# Patient Record
Sex: Male | Born: 1980 | Race: Black or African American | Hispanic: No | Marital: Single | Smoking: Never smoker
Health system: Southern US, Community
[De-identification: ages and names within clinical notes are randomized; demographics above are authoritative.]

---

## 2007-04-18 ENCOUNTER — Emergency Department (HOSPITAL_COMMUNITY): Admission: EM | Admit: 2007-04-18 | Discharge: 2007-04-18 | Payer: Self-pay | Admitting: Family Medicine

## 2011-05-07 ENCOUNTER — Emergency Department (HOSPITAL_COMMUNITY)
Admission: EM | Admit: 2011-05-07 | Discharge: 2011-05-07 | Disposition: A | Discharge status: Home / Self Care | Payer: Managed Care, Other (non HMO) | Attending: Emergency Medicine | Admitting: Emergency Medicine

## 2011-05-07 ENCOUNTER — Emergency Department (HOSPITAL_COMMUNITY): Payer: Managed Care, Other (non HMO)

## 2011-05-07 ENCOUNTER — Encounter (HOSPITAL_COMMUNITY): Payer: Self-pay | Admitting: *Deleted

## 2011-05-07 DIAGNOSIS — S62329A Displaced fracture of shaft of unspecified metacarpal bone, initial encounter for closed fracture: Secondary | ICD-10-CM | POA: Insufficient documentation

## 2011-05-07 DIAGNOSIS — R609 Edema, unspecified: Secondary | ICD-10-CM | POA: Insufficient documentation

## 2011-05-07 DIAGNOSIS — Y9239 Other specified sports and athletic area as the place of occurrence of the external cause: Secondary | ICD-10-CM | POA: Insufficient documentation

## 2011-05-07 DIAGNOSIS — M79609 Pain in unspecified limb: Secondary | ICD-10-CM | POA: Insufficient documentation

## 2011-05-07 DIAGNOSIS — S62306A Unspecified fracture of fifth metacarpal bone, right hand, initial encounter for closed fracture: Secondary | ICD-10-CM

## 2011-05-07 DIAGNOSIS — W219XXA Striking against or struck by unspecified sports equipment, initial encounter: Secondary | ICD-10-CM | POA: Insufficient documentation

## 2011-05-07 DIAGNOSIS — M7989 Other specified soft tissue disorders: Secondary | ICD-10-CM | POA: Insufficient documentation

## 2011-05-07 DIAGNOSIS — S6990XA Unspecified injury of unspecified wrist, hand and finger(s), initial encounter: Secondary | ICD-10-CM | POA: Insufficient documentation

## 2011-05-07 DIAGNOSIS — Y9367 Activity, basketball: Secondary | ICD-10-CM | POA: Insufficient documentation

## 2011-05-07 MED ORDER — PERCOCET 5-325 MG PO TABS: 1.0000 | ORAL_TABLET | Freq: Four times a day (QID) | ORAL | Status: AC | PRN

## 2011-05-07 NOTE — ED Provider Notes (Signed)
Medical screening examination/treatment/procedure(s) were performed by non-physician practitioner and as supervising physician I was immediately available for consultation/collaboration. Beyonka Pitney Y.   Gavin Pound. Felica Chargois, MD 05/07/11 1610

## 2011-05-07 NOTE — ED Notes (Signed)
Pt states he was playing basketball this morning and hit his right hand on the wall. Pt has some swelling to the right hand. Pt is able to move finger and has present pulse.

## 2011-05-07 NOTE — ED Provider Notes (Signed)
History     CSN: [id number]  Arrival date & time 05/07/11  4098   First MD Initiated Contact with Patient 05/07/11 [phone]      Chief Complaint  Patient presents with  . Hand Injury    (Consider location/radiation/quality/duration/timing/severity/associated sxs/prior treatment) HPI Comments: Patient reports he was playing basketball and ran into a wall with his right fist.  Reports pain and swelling of his right fifth metacarpal. Pain is described as slight and an "irritation."  Denies any decrease in sensation.  Denies any difficulty moving his fingers, hand or wrist.  Denies other injury.    Patient is a 31 y.o. male presenting with hand injury. The history is provided by the patient.  Hand Injury     History reviewed. No pertinent past medical history.  History reviewed. No pertinent past surgical history.  No family history on file.  History  Substance Use Topics  . Smoking status: Never Smoker   . Smokeless tobacco: Not on file  . Alcohol Use: No     occa      Review of Systems  All other systems reviewed and are negative.    Allergies  Review of patient's allergies indicates no known allergies.  Home Medications   Current Outpatient Rx  Name Route Sig Dispense Refill  . IBUPROFEN 200 MG PO TABS Oral Take 800 mg by mouth every 8 (eight) hours as needed. For pain.      BP 125/71  Pulse 78  Temp(Src) 98.1 F (36.7 C) (Oral)  Resp 22  Ht 6' (1.829 m)  Wt 165 lb (74.844 kg)  BMI 22.38 kg/m2  SpO2 98%  Physical Exam  Nursing note and vitals reviewed. Constitutional: He is oriented to person, place, and time. He appears well-developed and well-nourished.  HENT:  Head: Normocephalic and atraumatic.  Neck: Neck supple.  Pulmonary/Chest: Effort normal.  Musculoskeletal: Normal range of motion. He exhibits edema.       Hands:      Right hand with swelling and tenderness over dorsal fifth metacarpal.  Patient has full active range of motion of all digits  and full sensation of the digits.  Capillary refill is less than 2 seconds throughout.  Radial pulses intact.  Neurological: He is alert and oriented to person, place, and time.  Psychiatric: He has a normal mood and affect. His behavior is normal.    ED Course  Procedures (including critical care time)  Labs Reviewed - No data to display Dg Hand Complete Right  05/07/2011  *RADIOLOGY REPORT*  Clinical Data: Patient injured playing basketball yesterday  RIGHT HAND - COMPLETE 3+ VIEW  Comparison: None  Findings: There is an acute on chronic fracture deformity involving the distal shaft of the fifth metacarpal bone.  There is a mild volar and radial angulation of the distal fracture fragments.  There is a small exostosis arising from the distal shaft of the fifth proximal phalanx.  This appears to have fractured.  Likely chronic.  IMPRESSION:  1.  Acute on chronic boxer's fracture.  Original Report Authenticated By: Rosealee Albee, M.D.   9:57 AM discussed results with patient.  Patient has had a fifth metacarpal fracture in the past.  Have ordered a splint.  Patient inclines pain medication at this time.  1. Closed fracture of fifth metacarpal bone of right hand       MDM  Patient with acute fracture of fifth metacarpal with no displacement or angulation.  Patient declines pain medication and the  ED.  Splint placed in ED.  Patient discharged home with pain medication and hand surgery followup.        Rise Patience, [place] 05/07/11 1049

## 2012-11-02 ENCOUNTER — Emergency Department: Payer: Self-pay | Admitting: Emergency Medicine

## 2014-05-28 ENCOUNTER — Emergency Department (HOSPITAL_COMMUNITY)
Admission: EM | Admit: 2014-05-28 | Discharge: 2014-05-28 | Disposition: A | Discharge status: Home / Self Care | Payer: BLUE CROSS/BLUE SHIELD | Source: Home / Self Care | Attending: Family Medicine | Admitting: Family Medicine

## 2014-05-28 ENCOUNTER — Encounter (HOSPITAL_COMMUNITY): Payer: Self-pay | Admitting: Emergency Medicine

## 2014-05-28 ENCOUNTER — Emergency Department (INDEPENDENT_AMBULATORY_CARE_PROVIDER_SITE_OTHER): Payer: BLUE CROSS/BLUE SHIELD

## 2014-05-28 DIAGNOSIS — S20212A Contusion of left front wall of thorax, initial encounter: Secondary | ICD-10-CM

## 2014-05-28 NOTE — Discharge Instructions (Signed)
Chest Contusion °A chest contusion is a deep bruise on your chest area. Contusions are the result of an injury that caused bleeding under the skin. A chest contusion may involve bruising of the skin, muscles, or ribs. The contusion may turn blue, purple, or yellow. Minor injuries will give you a painless contusion, but more severe contusions may stay painful and swollen for a few weeks. °CAUSES  °A contusion is usually caused by a blow, trauma, or direct force to an area of the body. °SYMPTOMS  °· Swelling and redness of the injured area. °· Discoloration of the injured area. °· Tenderness and soreness of the injured area. °· Pain. °DIAGNOSIS  °The diagnosis can be made by taking a history and performing a physical exam. An X-ray, CT scan, or MRI may be needed to determine if there were any associated injuries, such as broken bones (fractures) or internal injuries. °TREATMENT  °Often, the best treatment for a chest contusion is resting, icing, and applying cold compresses to the injured area. Deep breathing exercises may be recommended to reduce the risk of pneumonia. Over-the-counter medicines may also be recommended for pain control. °HOME CARE INSTRUCTIONS  °· Put ice on the injured area. °· Put ice in a plastic bag. °· Place a towel between your skin and the bag. °· Leave the ice on for 15-20 minutes, 03-04 times a day. °· Only take over-the-counter or prescription medicines as directed by your caregiver. Your caregiver may recommend avoiding anti-inflammatory medicines (aspirin, ibuprofen, and naproxen) for 48 hours because these medicines may increase bruising. °· Rest the injured area. °· Perform deep-breathing exercises as directed by your caregiver. °· Stop smoking if you smoke. °· Do not lift objects over 5 pounds (2.3 kg) for 3 days or longer if recommended by your caregiver. °SEEK IMMEDIATE MEDICAL CARE IF:  °· You have increased bruising or swelling. °· You have pain that is getting worse. °· You have  difficulty breathing. °· You have dizziness, weakness, or fainting. °· You have blood in your urine or stool. °· You cough up or vomit blood. °· Your swelling or pain is not relieved with medicines. °MAKE SURE YOU:  °· Understand these instructions. °· Will watch your condition. °· Will get help right away if you are not doing well or get worse. °Document Released: 12/08/2000 Document Revised: 12/08/2011 Document Reviewed: 09/06/2011 °ExitCare® Patient Information ©2015 [employer]. This information is not intended to replace advice given to you by your health care provider. Make sure you discuss any questions you have with your health care provider. ° °Rib Contusion °A rib contusion (bruise) can occur by a blow to the chest or by a fall against a hard object. Usually these will be much better in a couple weeks. If X-rays were taken today and there are no broken bones (fractures), the diagnosis of bruising is made. However, broken ribs may not show up for several days, or may be discovered later on a routine X-ray when signs of healing show up. If this happens to you, it does not mean that something was missed on the X-ray, but simply that it did not show up on the first X-rays. Earlier diagnosis will not usually change the treatment. °HOME CARE INSTRUCTIONS  °· Avoid strenuous activity. Be careful during activities and avoid bumping the injured ribs. Activities that pull on the injured ribs and cause pain should be avoided, if possible. °· For the first day or two, an ice pack used every 20 minutes while   awake may be helpful. Put ice in a plastic bag and put a towel between the bag and the skin. °· Eat a normal, well-balanced diet. Drink plenty of fluids to avoid constipation. °· Take deep breaths several times a day to keep lungs free of infection. Try to cough several times a day. Splint the injured area with a pillow while coughing to ease pain. Coughing can help prevent pneumonia. °· Wear a rib belt or binder  only if told to do so by your caregiver. If you are wearing a rib belt or binder, you must do the breathing exercises as directed by your caregiver. If not used properly, rib belts or binders restrict breathing which can lead to pneumonia. °· Only take over-the-counter or prescription medicines for pain, discomfort, or fever as directed by your caregiver. °SEEK MEDICAL CARE IF:  °· You or your child has an oral temperature above 102° F (38.9° C). °· Your baby is older than 3 months with a rectal temperature of 100.5° F (38.1° C) or higher for more than 1 day. °· You develop a cough, with thick or bloody sputum. °SEEK IMMEDIATE MEDICAL CARE IF:  °· You have difficulty breathing. °· You feel sick to your stomach (nausea), have vomiting or belly (abdominal) pain. °· You have worsening pain, not controlled with medications, or there is a change in the location of the pain. °· You develop sweating or radiation of the pain into the arms, jaw or shoulders, or become light headed or faint. °· You or your child has an oral temperature above 102° F (38.9° C), not controlled by medicine. °· Your or your baby is older than 3 months with a rectal temperature of 102° F (38.9° C) or higher. °· Your baby is 3 months old or younger with a rectal temperature of 100.4° F (38° C) or higher. °MAKE SURE YOU:  °· Understand these instructions. °· Will watch your condition. °· Will get help right away if you are not doing well or get worse. °Document Released: 12/08/2000 Document Revised: 07/10/2012 Document Reviewed: 11/01/2007 °ExitCare® Patient Information ©2015 [employer]. This information is not intended to replace advice given to you by your health care provider. Make sure you discuss any questions you have with your health care provider. ° °

## 2014-05-28 NOTE — ED Notes (Signed)
Pt reports someone falling on top of him and is c/o left sided rib pain.  Mild relief in pain with using ibuprofen.  States "My ribs hurt when I sneeze".   Denies SOB.

## 2014-05-28 NOTE — ED Provider Notes (Signed)
CSN: [id number]     Arrival date & time 05/28/14  [id number] History   First MD Initiated Contact with Patient 05/28/14 [phone]     Chief Complaint  Patient presents with  . Rib Injury   (Consider location/radiation/quality/duration/timing/severity/associated sxs/prior Treatment) HPI Comments: Patient states he was playing in a football game on May 18, 2014 and another player landed on top of him. His left lower anterior rib cage has remained sore when he sneezes or coughs.   The history is provided by the patient.    History reviewed. No pertinent past medical history. History reviewed. No pertinent past surgical history. History reviewed. No pertinent family history. History  Substance Use Topics  . Smoking status: Never Smoker   . Smokeless tobacco: Not on file  . Alcohol Use: Yes     Comment: occa    Review of Systems  All other systems reviewed and are negative.   Allergies  Review of patient's allergies indicates no known allergies.  Home Medications   Prior to Admission medications   Medication Sig Start Date End Date Taking? Authorizing Provider  ibuprofen (ADVIL,MOTRIN) 200 MG tablet Take 800 mg by mouth every 8 (eight) hours as needed. For pain.   Yes Historical Provider, MD   BP 136/83 mmHg  Pulse 64  Resp 12  SpO2 98% Physical Exam  Constitutional: He is oriented to person, place, and time. He appears well-developed and well-nourished. No distress.  HENT:  Head: Normocephalic and atraumatic.  Eyes: Conjunctivae are normal.  Cardiovascular: Normal rate, regular rhythm and normal heart sounds.   Pulmonary/Chest: Effort normal and breath sounds normal. No respiratory distress. He has no wheezes. He exhibits tenderness. He exhibits no crepitus, no deformity, no swelling and no retraction.    Outlined area is region of discomfort  Musculoskeletal: Normal range of motion.  Neurological: He is alert and oriented to person, place, and time.  Skin: Skin is warm and dry.    Psychiatric: He has a normal mood and affect. His behavior is normal.  Nursing note and vitals reviewed.   ED Course  Procedures (including critical care time) Labs Review Labs Reviewed - No data to display  Imaging Review Dg Ribs Unilateral W/chest Left  05/28/2014   CLINICAL DATA:  Rib injury 2 weeks ago, during football game, left lower rib pain  EXAM: LEFT RIBS AND CHEST - 3+ VIEW  COMPARISON:  None.  FINDINGS: Five views left ribs submitted. No acute infiltrate or pulmonary edema. No left rib fracture. No pneumothorax.  IMPRESSION: Negative.   Electronically Signed   By: Natasha MeadLiviu  Pop M.D.   On: 05/28/2014 10:14     MDM   1. Chest wall contusion, left, initial encounter    Films unremarkable. Ibuprofen or tylenol as directed on packaging for pain. No clinical findings to suggest intraabdominal process.    Ria ClockJennifer Lee H Jamoni Hewes, [place] 05/28/14 1022

## 2017-01-10 IMAGING — DX DG RIBS W/ CHEST 3+V*L*
5 series · 5 of 5 positions shown · non-contrast
Comparison: None.

CLINICAL DATA: Rib injury 2 weeks ago, during football game, left
lower rib pain

EXAM:
LEFT RIBS AND CHEST - 3+ VIEW

[chest pa]
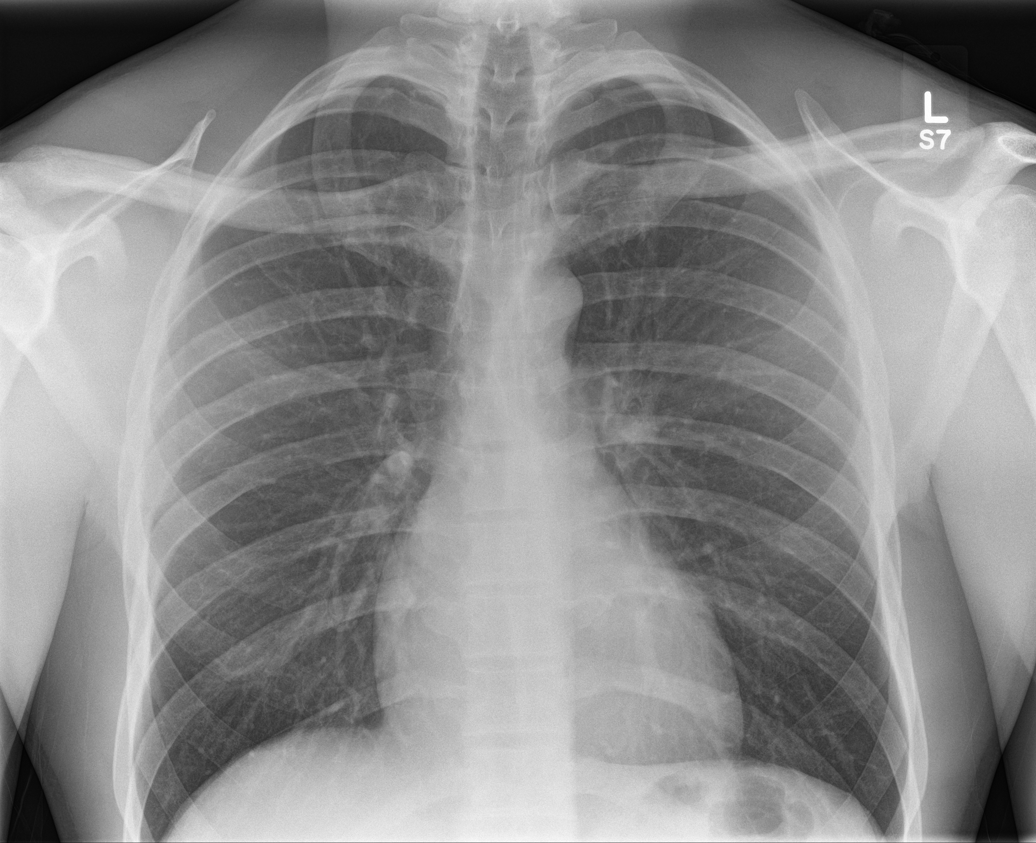

[rib pa (1 of 2)]
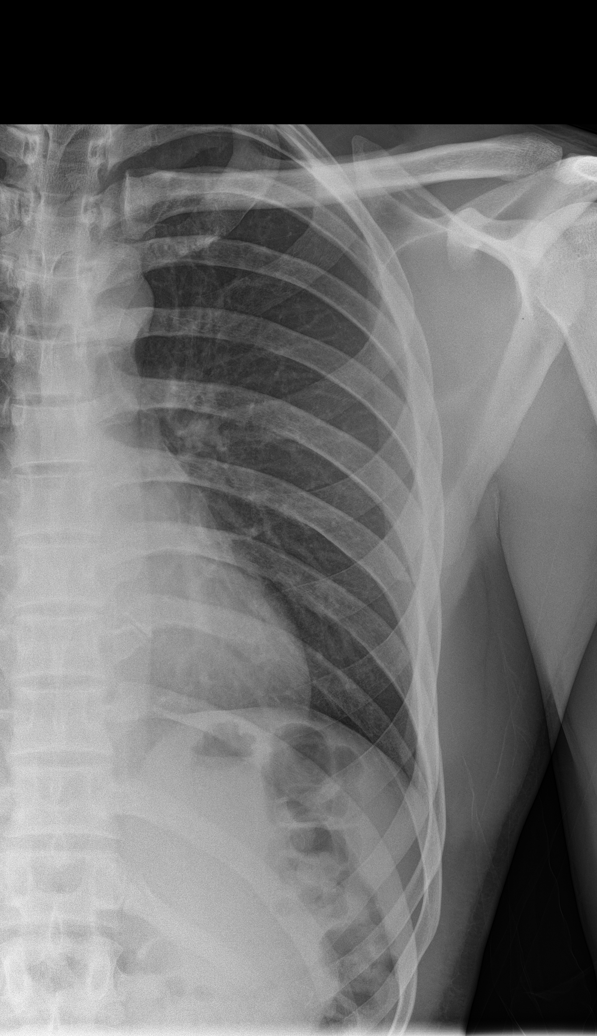

[rib pa (2 of 2)]
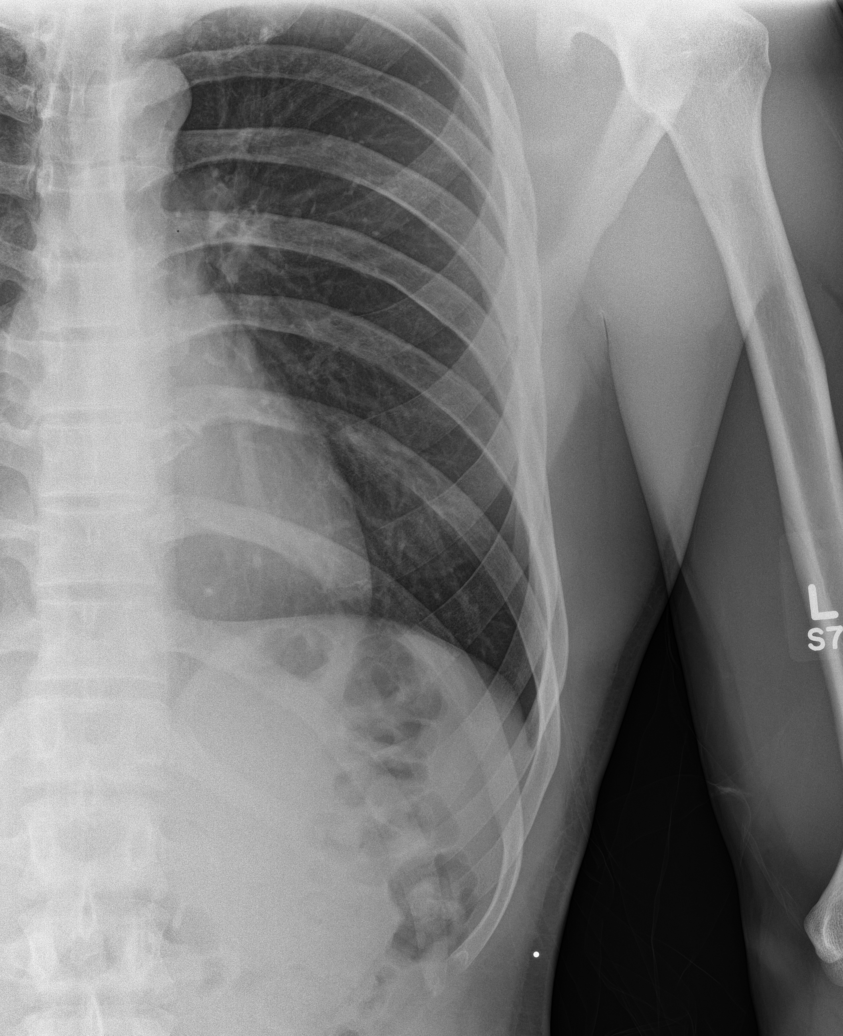

[rib obl (1 of 2)]
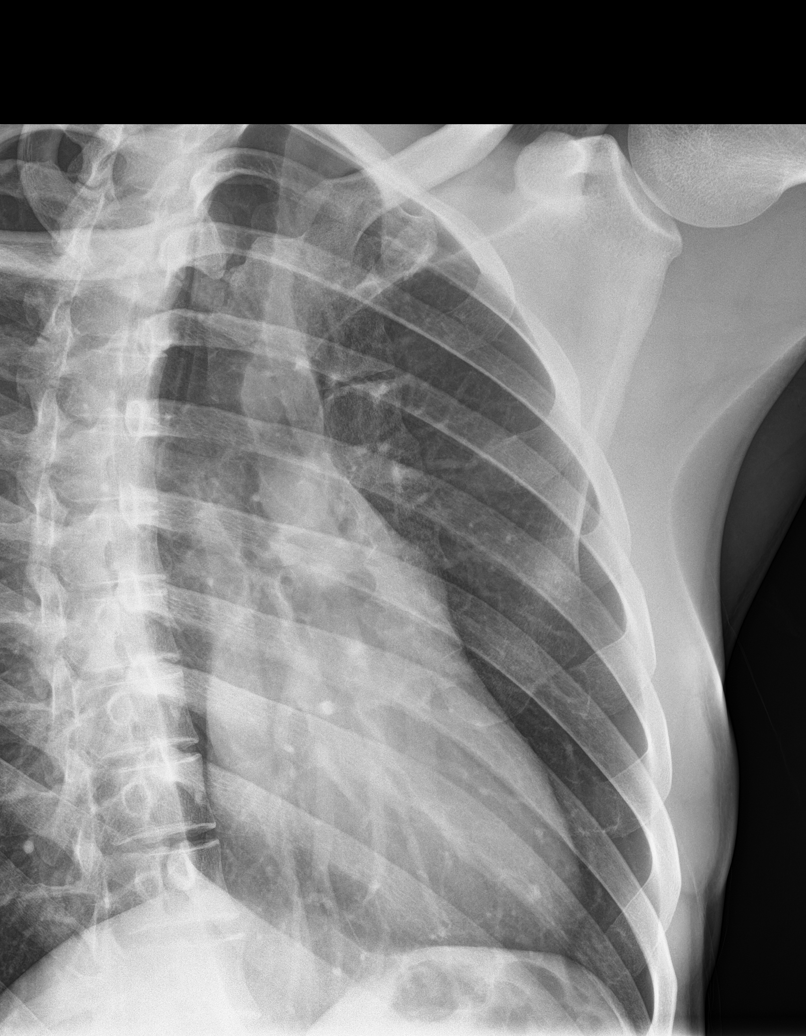

[rib obl (2 of 2)]
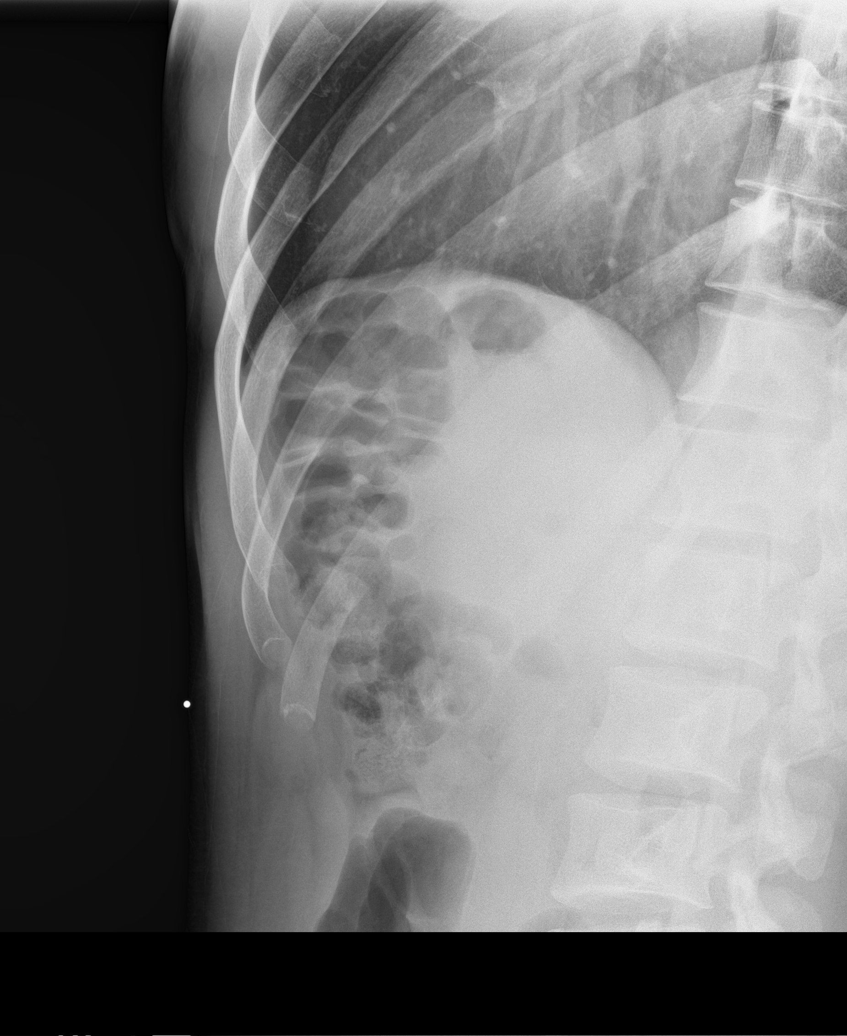

[5 of 5 positions shown; findings below may reference images not displayed]

FINDINGS: Five views left ribs submitted. No acute infiltrate or pulmonary
edema. No left rib fracture. No pneumothorax.
IMPRESSION: Negative.
